# Patient Record
Sex: Female | Born: 1955 | Race: White | Hispanic: No | Marital: Married | Smoking: Never smoker
Health system: Southern US, Community
[De-identification: ages and names within clinical notes are randomized; demographics above are authoritative.]

---

## 2004-12-01 ENCOUNTER — Ambulatory Visit: Payer: Self-pay | Admitting: Unknown Physician Specialty

## 2005-01-28 ENCOUNTER — Ambulatory Visit: Payer: Self-pay | Admitting: General Surgery

## 2005-02-01 ENCOUNTER — Ambulatory Visit: Payer: Self-pay | Admitting: General Surgery

## 2005-08-09 ENCOUNTER — Ambulatory Visit: Payer: Self-pay | Admitting: Family Medicine

## 2005-10-07 ENCOUNTER — Ambulatory Visit: Payer: Self-pay | Admitting: Unknown Physician Specialty

## 2006-03-01 ENCOUNTER — Ambulatory Visit: Payer: Self-pay | Admitting: General Surgery

## 2006-04-21 ENCOUNTER — Ambulatory Visit: Payer: Self-pay | Admitting: Urology

## 2006-08-05 ENCOUNTER — Ambulatory Visit: Payer: Self-pay | Admitting: Urology

## 2006-08-08 ENCOUNTER — Ambulatory Visit: Payer: Self-pay | Admitting: Urology

## 2006-08-22 ENCOUNTER — Ambulatory Visit: Payer: Self-pay | Admitting: Urology

## 2006-09-01 ENCOUNTER — Ambulatory Visit: Payer: Self-pay | Admitting: Urology

## 2006-09-07 ENCOUNTER — Ambulatory Visit: Payer: Self-pay | Admitting: Urology

## 2006-09-08 ENCOUNTER — Ambulatory Visit: Payer: Self-pay | Admitting: Urology

## 2007-03-02 ENCOUNTER — Ambulatory Visit: Payer: Self-pay | Admitting: General Surgery

## 2007-03-27 ENCOUNTER — Ambulatory Visit: Payer: Self-pay | Admitting: General Surgery

## 2007-03-28 ENCOUNTER — Ambulatory Visit: Payer: Self-pay | Admitting: Internal Medicine

## 2007-04-14 ENCOUNTER — Ambulatory Visit: Payer: Self-pay | Admitting: General Surgery

## 2007-04-21 ENCOUNTER — Ambulatory Visit: Payer: Self-pay | Admitting: Internal Medicine

## 2007-04-25 ENCOUNTER — Other Ambulatory Visit: Payer: Self-pay

## 2007-04-28 ENCOUNTER — Ambulatory Visit: Payer: Self-pay | Admitting: Internal Medicine

## 2007-05-02 ENCOUNTER — Ambulatory Visit: Payer: Self-pay | Admitting: General Surgery

## 2007-05-05 ENCOUNTER — Ambulatory Visit: Payer: Self-pay | Admitting: Oncology

## 2007-05-29 ENCOUNTER — Ambulatory Visit: Payer: Self-pay | Admitting: Internal Medicine

## 2007-06-28 ENCOUNTER — Ambulatory Visit: Payer: Self-pay | Admitting: Internal Medicine

## 2007-07-19 ENCOUNTER — Other Ambulatory Visit: Payer: Self-pay

## 2007-07-29 ENCOUNTER — Ambulatory Visit: Payer: Self-pay | Admitting: Internal Medicine

## 2007-08-28 ENCOUNTER — Ambulatory Visit: Payer: Self-pay | Admitting: Internal Medicine

## 2007-09-28 ENCOUNTER — Ambulatory Visit: Payer: Self-pay | Admitting: Radiation Oncology

## 2007-09-28 ENCOUNTER — Ambulatory Visit: Payer: Self-pay | Admitting: Internal Medicine

## 2007-10-29 ENCOUNTER — Ambulatory Visit: Payer: Self-pay | Admitting: Internal Medicine

## 2007-11-26 ENCOUNTER — Ambulatory Visit: Payer: Self-pay | Admitting: Internal Medicine

## 2007-12-11 ENCOUNTER — Encounter: Payer: Self-pay | Admitting: General Surgery

## 2007-12-27 ENCOUNTER — Ambulatory Visit: Payer: Self-pay | Admitting: Internal Medicine

## 2007-12-27 ENCOUNTER — Encounter: Payer: Self-pay | Admitting: General Surgery

## 2008-01-26 ENCOUNTER — Ambulatory Visit: Payer: Self-pay | Admitting: Oncology

## 2008-01-30 IMAGING — CT CT STONE STUDY
1 of 2 series · 15 of 32 positions shown, 19 images · non-contrast
Comparison: none

REASON FOR EXAM: Urethral calculus
COMMENTS:

[Series 2: stone · axial · 0.69mm/px · z∈[-986,-605]mm · 15 of 144 slices shown, 19 images]
[im 11/144  soft-tissue]
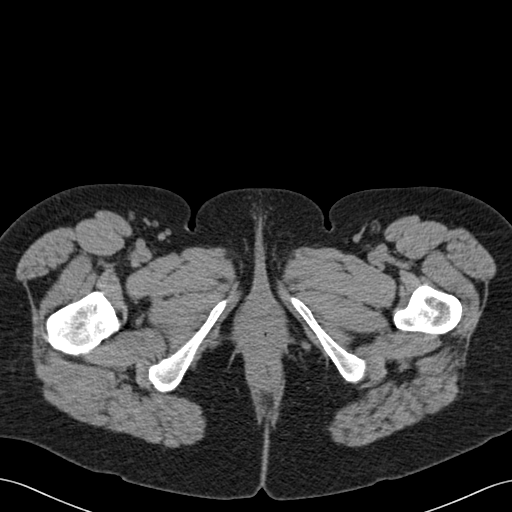
[im 11/144  bone]
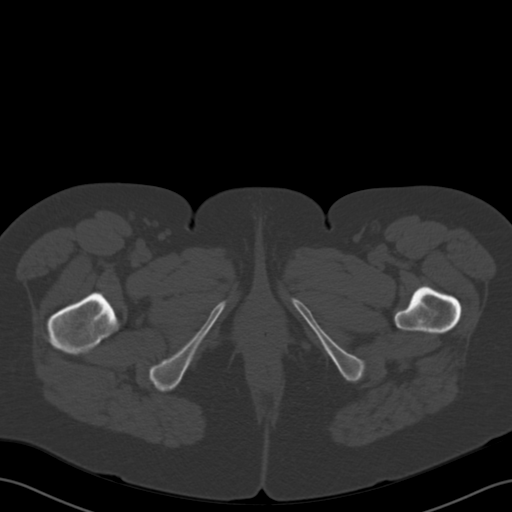
[im 21/144  soft-tissue]
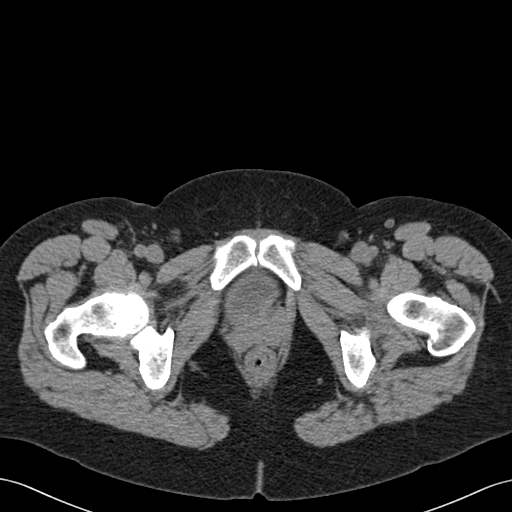
[im 31/144  soft-tissue]
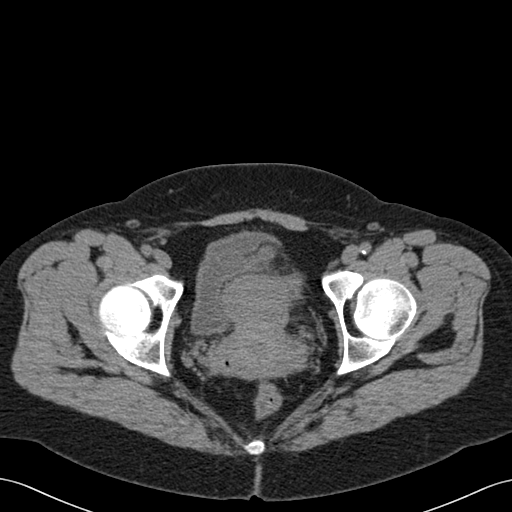
[im 41/144  soft-tissue]
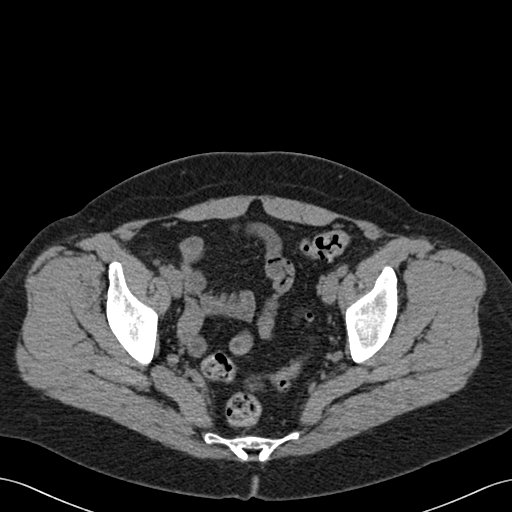
[im 52/144  soft-tissue]
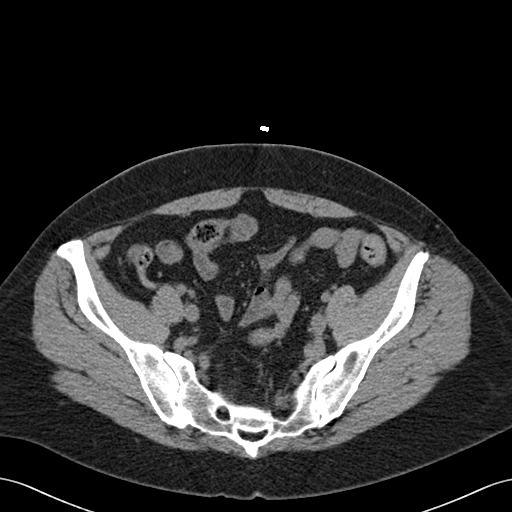
[im 62/144  soft-tissue]
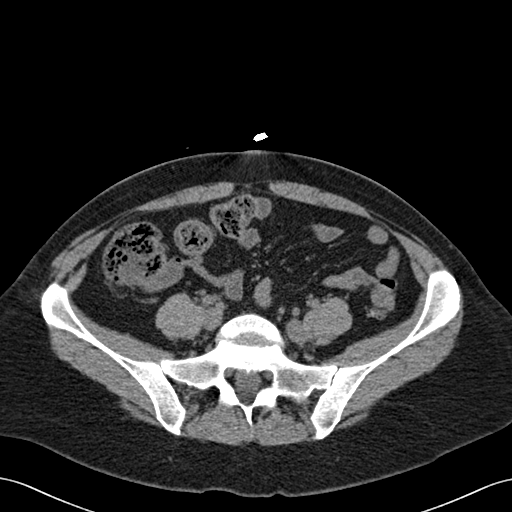
[im 72/144  soft-tissue]
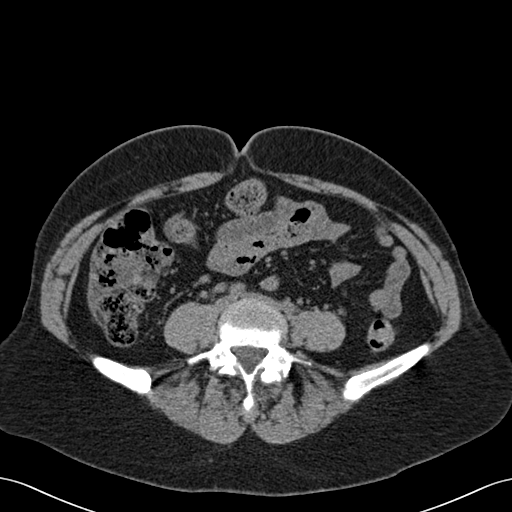
[im 82/144  soft-tissue]
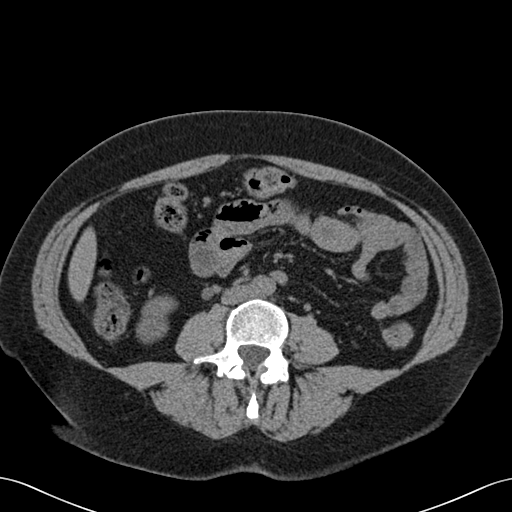
[im 92/144  soft-tissue]
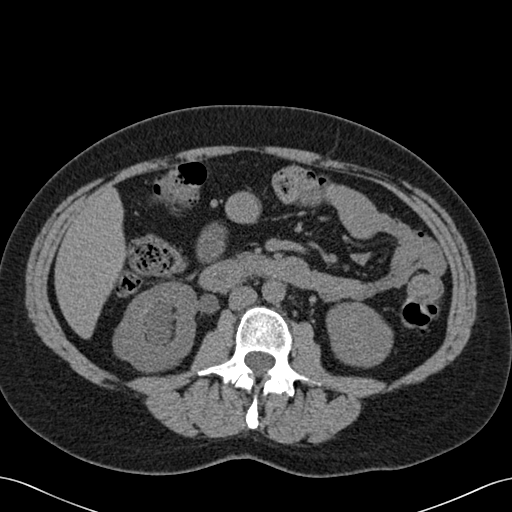
[im 92/144  bone]
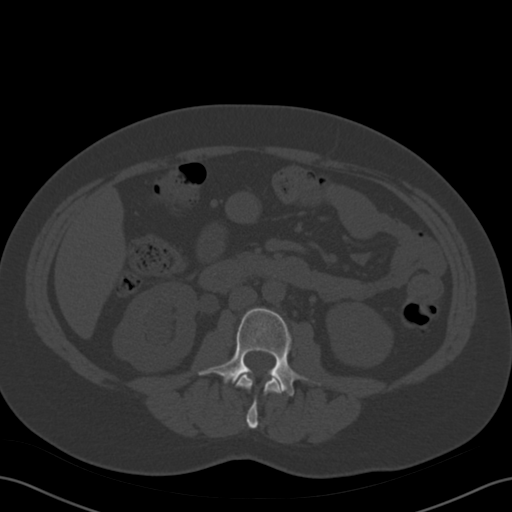
[im 103/144  soft-tissue]
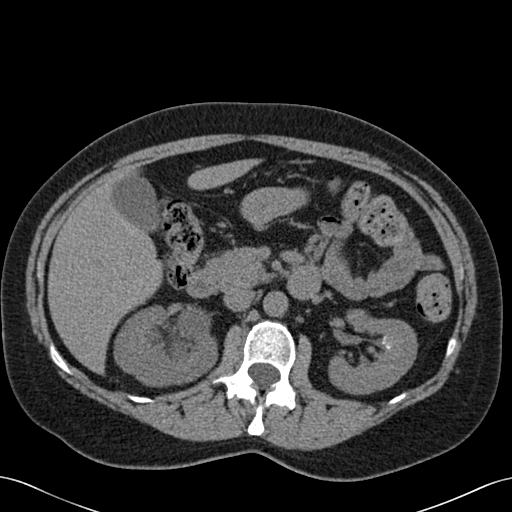
[im 113/144  soft-tissue]
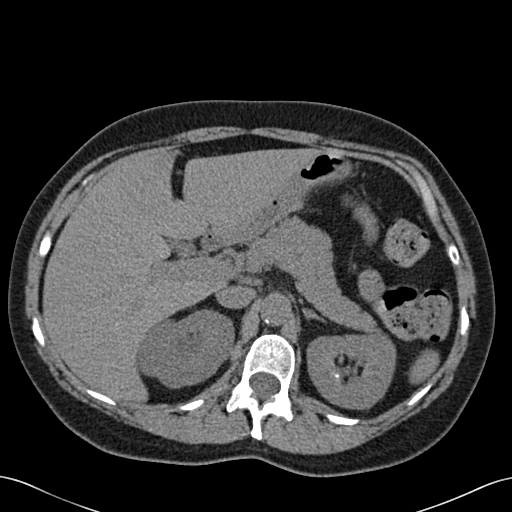
[im 123/144  soft-tissue]
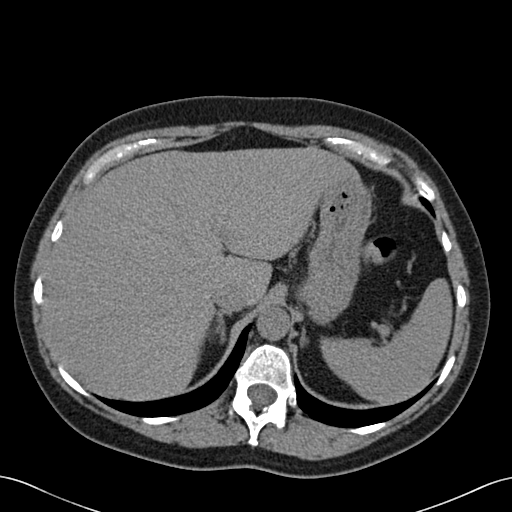
[im 123/144  lung]
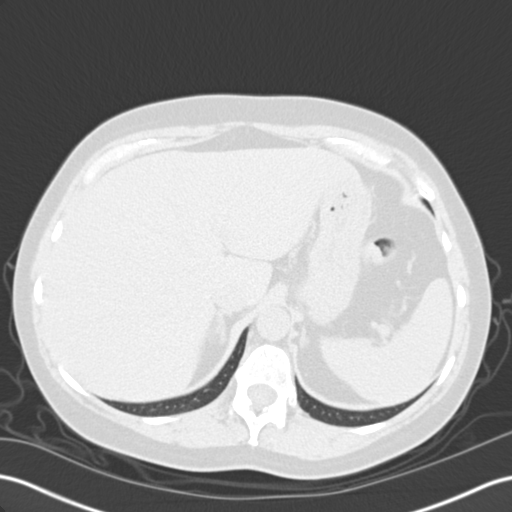
[im 128/144  lung]
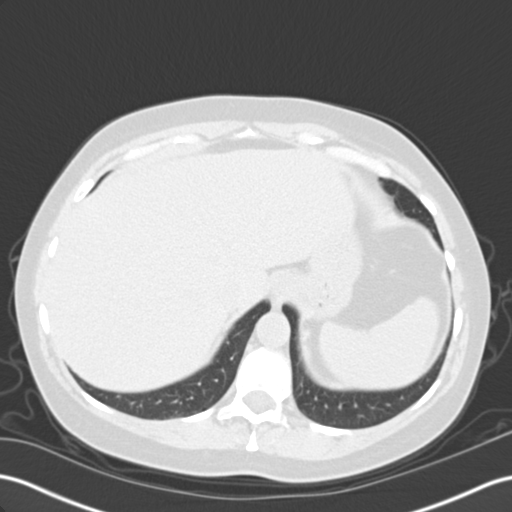
[im 133/144  soft-tissue]
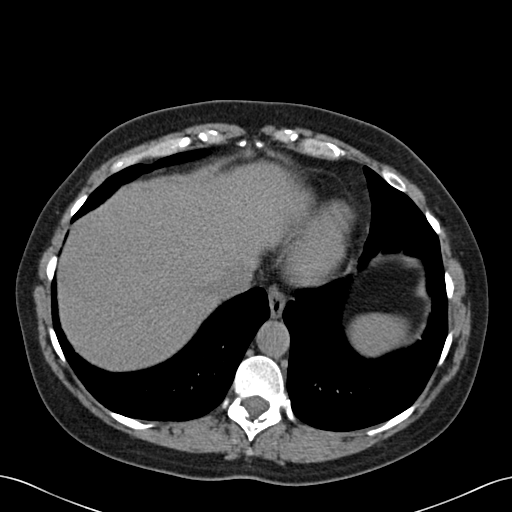
[im 133/144  lung]
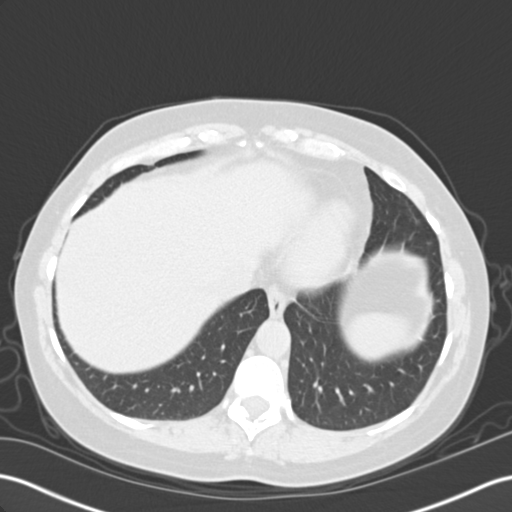
[im 138/144  lung]
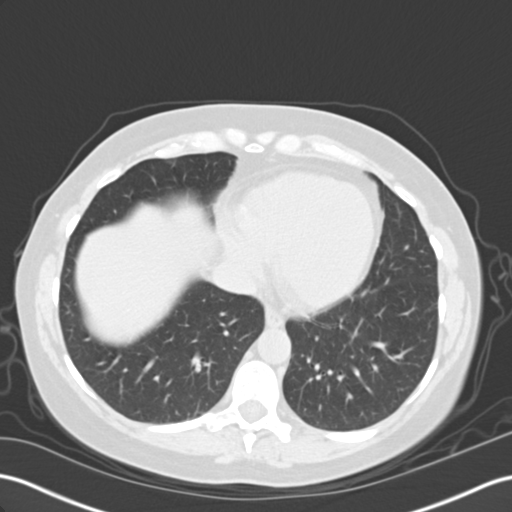

[15 of 32 positions shown; findings below may reference images not displayed]

PROCEDURE:     CT  - CT ABDOMEN /PELVIS WO (STONE)  - September 07, 2006  [DATE]

RESULT:       Helical noncontrast 3 mm sections were obtained from the lung
bases through the pubic symphysis.

Evaluation of the lung bases demonstrates no gross abnormalities.

Evaluation of the RIGHT kidney demonstrates moderate to mildly severe
hydronephrosis involving the kidney as well as hydroureter.  A calculus is
demonstrated within the mid to distal RIGHT ureter near the L4 level.  This
is at the level of the pelvic inlet.  When compared to previous studies,
this ureteral calculus is unchanged.  There does not appear to be associated
significant periureteral inflammatory change.  Evaluation of the LEFT kidney
once again demonstrates multiple nonobstructing medullary calculi within the
midpole region of the kidney primarily posteriorly.  These findings appear
unchanged.  Noncontrast evaluation of the liver, spleen, adrenals, pancreas
is unremarkable.  There does not appear to be evidence of abdominal free
fluid, drainable loculated fluid collections, adenopathy or masses.  There
is no evidence of abdominal aortic aneurysm identified.  Evaluation of the
pelvis demonstrates no evidence of free fluid, drainable loculated fluid
collection, masses or adenopathy.
IMPRESSION: 1.     Stable known RIGHT ureteral calculus as described above with
associated moderate to severe obstructive uropathy.
2.     Stable nonobstructing calculi within the LEFT kidney.

## 2008-02-01 ENCOUNTER — Ambulatory Visit: Payer: Self-pay | Admitting: Oncology

## 2008-02-26 ENCOUNTER — Ambulatory Visit: Payer: Self-pay | Admitting: Oncology

## 2008-03-04 ENCOUNTER — Ambulatory Visit: Payer: Self-pay | Admitting: General Surgery

## 2008-03-27 ENCOUNTER — Ambulatory Visit: Payer: Self-pay | Admitting: Oncology

## 2008-04-27 ENCOUNTER — Ambulatory Visit: Payer: Self-pay | Admitting: Oncology

## 2008-05-28 ENCOUNTER — Ambulatory Visit: Payer: Self-pay | Admitting: Oncology

## 2008-06-27 ENCOUNTER — Ambulatory Visit: Payer: Self-pay | Admitting: Oncology

## 2008-07-28 ENCOUNTER — Ambulatory Visit: Payer: Self-pay | Admitting: Oncology

## 2008-08-23 ENCOUNTER — Ambulatory Visit: Payer: Self-pay | Admitting: Oncology

## 2008-08-27 ENCOUNTER — Ambulatory Visit: Payer: Self-pay | Admitting: Oncology

## 2008-08-30 ENCOUNTER — Ambulatory Visit: Payer: Self-pay | Admitting: Urology

## 2008-09-23 IMAGING — CR DG CHEST 1V PORT
1 series · 1 of 1 positions shown · non-contrast
Comparison: none

REASON FOR EXAM: port a cath placement. PLEASE COMPLETE ERECT!
COMMENTS:

[view not recorded]
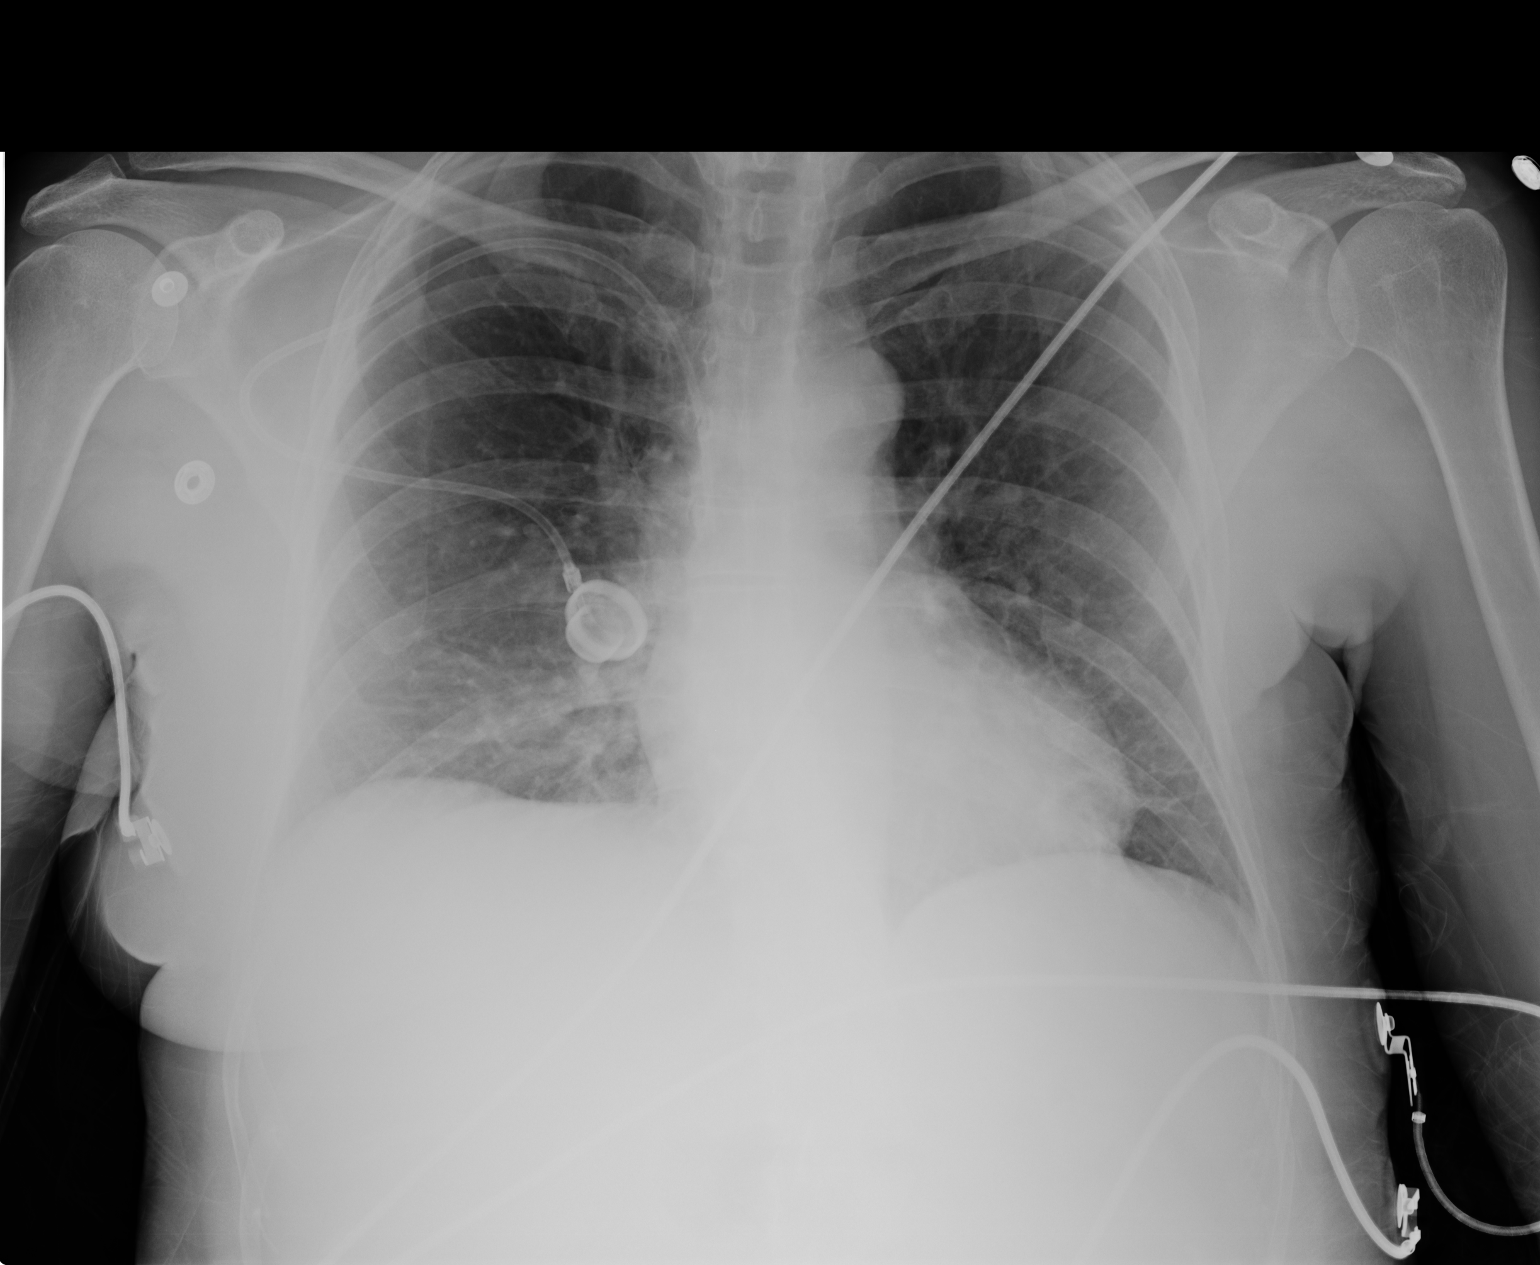

[1 of 1 positions shown; findings below may reference images not displayed]

PROCEDURE:     DXR - DXR PORTABLE CHEST SINGLE VIEW  - May 02, 2007  [DATE]

RESULT:     AP view of the chest shows a Port-A-Cath to be present. The tip
is projected near the junction of the superior vena cava and RIGHT atrium.
No pneumothorax is seen. No pleural effusion is observed. The lung fields
are clear except for slight discoid atelectasis at the LEFT base.
IMPRESSION: 1. The tip of the Port-A-Cath is projected near the junction of the superior
vena cava and RIGHT atrium.
2. No pneumothorax is seen.

## 2008-09-27 ENCOUNTER — Ambulatory Visit: Payer: Self-pay | Admitting: Oncology

## 2008-10-28 ENCOUNTER — Ambulatory Visit: Payer: Self-pay | Admitting: Oncology

## 2008-11-25 ENCOUNTER — Ambulatory Visit: Payer: Self-pay | Admitting: Oncology

## 2008-12-06 ENCOUNTER — Ambulatory Visit: Payer: Self-pay | Admitting: Radiation Oncology

## 2008-12-26 ENCOUNTER — Ambulatory Visit: Payer: Self-pay | Admitting: Radiation Oncology

## 2008-12-26 ENCOUNTER — Ambulatory Visit: Payer: Self-pay | Admitting: Oncology

## 2009-02-04 ENCOUNTER — Ambulatory Visit: Payer: Self-pay | Admitting: Unknown Physician Specialty

## 2009-02-11 ENCOUNTER — Ambulatory Visit: Payer: Self-pay | Admitting: Unknown Physician Specialty

## 2009-03-06 ENCOUNTER — Ambulatory Visit: Payer: Self-pay | Admitting: General Surgery

## 2009-03-27 ENCOUNTER — Ambulatory Visit: Payer: Self-pay | Admitting: Oncology

## 2009-04-09 ENCOUNTER — Ambulatory Visit: Payer: Self-pay | Admitting: Oncology

## 2009-04-27 ENCOUNTER — Ambulatory Visit: Payer: Self-pay | Admitting: Oncology

## 2009-05-02 ENCOUNTER — Ambulatory Visit: Payer: Self-pay | Admitting: General Surgery

## 2009-07-18 ENCOUNTER — Ambulatory Visit: Payer: Self-pay | Admitting: Oncology

## 2009-07-28 ENCOUNTER — Ambulatory Visit: Payer: Self-pay | Admitting: Oncology

## 2009-08-27 ENCOUNTER — Ambulatory Visit: Payer: Self-pay | Admitting: Oncology

## 2009-11-25 ENCOUNTER — Ambulatory Visit: Payer: Self-pay | Admitting: Oncology

## 2009-12-18 ENCOUNTER — Ambulatory Visit: Payer: Self-pay | Admitting: Oncology

## 2009-12-26 ENCOUNTER — Ambulatory Visit: Payer: Self-pay | Admitting: Oncology

## 2010-01-25 ENCOUNTER — Ambulatory Visit: Payer: Self-pay | Admitting: Oncology

## 2010-01-30 ENCOUNTER — Ambulatory Visit: Payer: Self-pay | Admitting: Oncology

## 2010-02-25 ENCOUNTER — Ambulatory Visit: Payer: Self-pay | Admitting: Oncology

## 2010-03-09 ENCOUNTER — Ambulatory Visit: Payer: Self-pay | Admitting: General Surgery

## 2010-03-27 ENCOUNTER — Ambulatory Visit: Payer: Self-pay | Admitting: Oncology

## 2010-04-03 ENCOUNTER — Inpatient Hospital Stay: Payer: Self-pay | Admitting: Internal Medicine

## 2010-04-27 ENCOUNTER — Ambulatory Visit: Payer: Self-pay | Admitting: Oncology

## 2010-05-23 LAB — CANCER ANTIGEN 27.29: CA 27.29: 16 U/mL (ref 0.0–38.6)

## 2010-05-28 ENCOUNTER — Ambulatory Visit: Payer: Self-pay | Admitting: Oncology

## 2010-11-16 ENCOUNTER — Ambulatory Visit: Payer: Self-pay | Admitting: Oncology

## 2010-11-17 LAB — CANCER ANTIGEN 27.29: CA 27.29: 12.1 U/mL (ref 0.0–38.6)

## 2010-11-26 ENCOUNTER — Ambulatory Visit: Payer: Self-pay | Admitting: Oncology

## 2010-12-15 ENCOUNTER — Ambulatory Visit: Payer: Self-pay | Admitting: Internal Medicine

## 2010-12-27 ENCOUNTER — Ambulatory Visit: Payer: Self-pay | Admitting: Oncology

## 2011-03-18 ENCOUNTER — Ambulatory Visit: Payer: Self-pay | Admitting: General Surgery

## 2011-04-05 ENCOUNTER — Ambulatory Visit: Payer: Self-pay | Admitting: Urology

## 2011-05-20 ENCOUNTER — Ambulatory Visit: Payer: Self-pay | Admitting: Oncology

## 2011-05-21 LAB — CANCER ANTIGEN 27.29: CA 27.29: 16.7 U/mL (ref 0.0–38.6)

## 2011-05-29 ENCOUNTER — Ambulatory Visit: Payer: Self-pay | Admitting: Oncology

## 2011-06-24 ENCOUNTER — Ambulatory Visit: Payer: Self-pay | Admitting: Urology

## 2011-06-28 ENCOUNTER — Ambulatory Visit: Payer: Self-pay | Admitting: Urology

## 2011-06-28 ENCOUNTER — Ambulatory Visit: Payer: Self-pay | Admitting: Oncology

## 2011-07-01 ENCOUNTER — Ambulatory Visit: Payer: Self-pay | Admitting: Urology

## 2011-07-15 ENCOUNTER — Ambulatory Visit: Payer: Self-pay | Admitting: Urology

## 2011-07-27 ENCOUNTER — Ambulatory Visit: Payer: Self-pay | Admitting: Urology

## 2011-10-29 ENCOUNTER — Ambulatory Visit: Payer: Self-pay | Admitting: Oncology

## 2011-10-29 LAB — CBC CANCER CENTER
Basophil %: 0.5 %
Eosinophil #: 0.1 x10 3/mm (ref 0.0–0.7)
Eosinophil %: 0.9 %
HGB: 13.6 g/dL (ref 12.0–16.0)
MCH: 32 pg (ref 26.0–34.0)
MCV: 92 fL (ref 80–100)
Monocyte %: 6.3 %
Neutrophil %: 67.6 %
Platelet: 312 x10 3/mm (ref 150–440)
RBC: 4.26 10*6/uL (ref 3.80–5.20)
WBC: 8 x10 3/mm (ref 3.6–11.0)

## 2011-10-29 LAB — COMPREHENSIVE METABOLIC PANEL
Albumin: 3.7 g/dL (ref 3.4–5.0)
Alkaline Phosphatase: 119 U/L (ref 50–136)
BUN: 15 mg/dL (ref 7–18)
Bilirubin,Total: 0.2 mg/dL (ref 0.2–1.0)
Co2: 29 mmol/L (ref 21–32)
Creatinine: 0.87 mg/dL (ref 0.60–1.30)
EGFR (African American): >60
EGFR (Non-African Amer.): >60
Glucose: 87 mg/dL (ref 65–99)
Osmolality: 281 (ref 275–301)
SGOT(AST): 18 U/L (ref 15–37)
SGPT (ALT): 34 U/L
Sodium: 141 mmol/L (ref 136–145)
Total Protein: 7.4 g/dL (ref 6.4–8.2)

## 2011-10-30 LAB — CANCER ANTIGEN 27.29: CA 27.29: 15.4 U/mL (ref 0.0–38.6)

## 2011-11-26 ENCOUNTER — Ambulatory Visit: Payer: Self-pay | Admitting: Oncology

## 2012-04-21 ENCOUNTER — Ambulatory Visit: Payer: Self-pay | Admitting: Oncology

## 2012-04-21 LAB — COMPREHENSIVE METABOLIC PANEL
Albumin: 3.6 g/dL (ref 3.4–5.0)
Alkaline Phosphatase: 116 U/L (ref 50–136)
Anion Gap: 7 (ref 7–16)
Calcium, Total: 9.8 mg/dL (ref 8.5–10.1)
Chloride: 103 mmol/L (ref 98–107)
Co2: 29 mmol/L (ref 21–32)
EGFR (Non-African Amer.): >60
Glucose: 95 mg/dL (ref 65–99)
Osmolality: 279 (ref 275–301)
Potassium: 3.9 mmol/L (ref 3.5–5.1)
SGOT(AST): 27 U/L (ref 15–37)
Sodium: 139 mmol/L (ref 136–145)

## 2012-04-21 LAB — CBC CANCER CENTER
Basophil #: 0 x10 3/mm (ref 0.0–0.1)
Eosinophil #: 0.1 x10 3/mm (ref 0.0–0.7)
HCT: 40.4 % (ref 35.0–47.0)
Lymphocyte #: 1.6 x10 3/mm (ref 1.0–3.6)
Lymphocyte %: 29.5 %
MCHC: 34.3 g/dL (ref 32.0–36.0)
MCV: 92 fL (ref 80–100)
Monocyte #: 0.4 x10 3/mm (ref 0.2–0.9)
Monocyte %: 6.5 %
Neutrophil #: 3.5 x10 3/mm (ref 1.4–6.5)
Platelet: 292 x10 3/mm (ref 150–440)
RBC: 4.38 10*6/uL (ref 3.80–5.20)
RDW: 13 % (ref 11.5–14.5)

## 2012-04-27 ENCOUNTER — Ambulatory Visit: Payer: Self-pay | Admitting: Oncology

## 2012-08-27 IMAGING — CR DG ABDOMEN 1V
1 series · 1 of 1 positions shown · non-contrast
Comparison: none

REASON FOR EXAM: nephrolithiasis
COMMENTS:

[view not recorded]
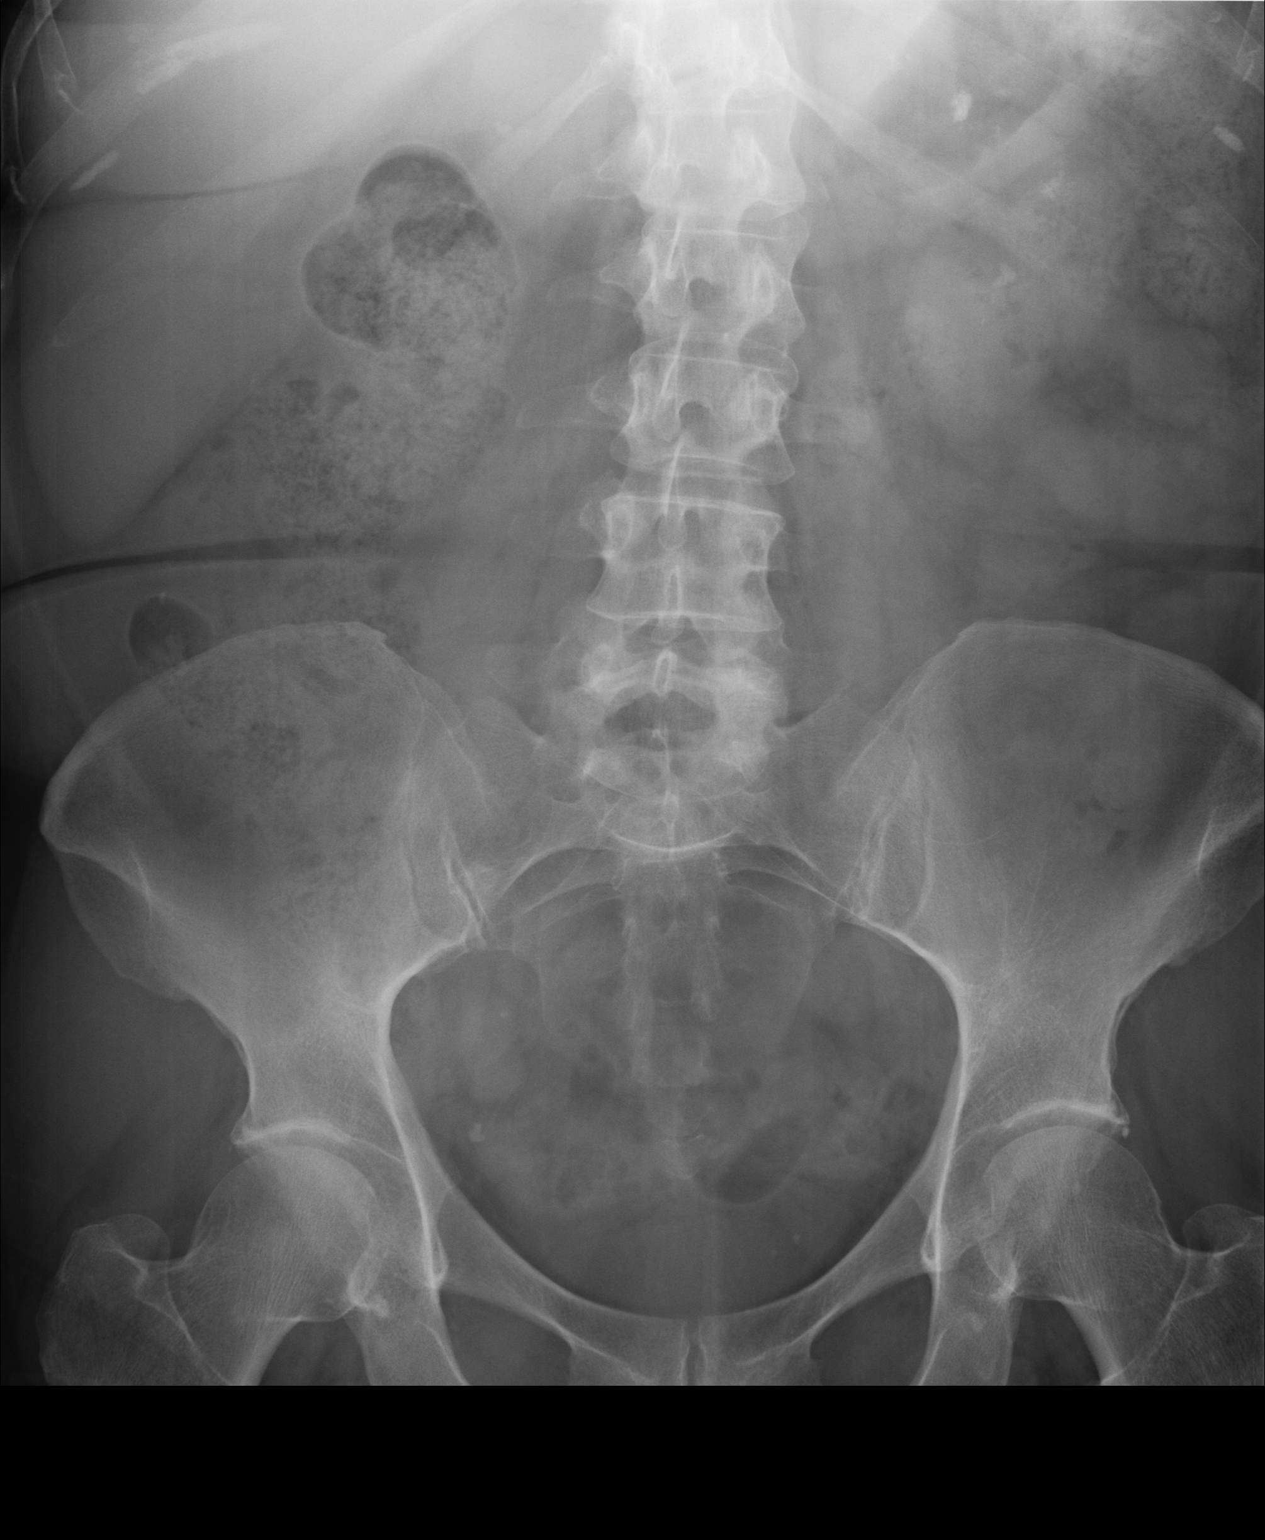

[1 of 1 positions shown; findings below may reference images not displayed]

PROCEDURE:     DXR - DXR KIDNEY URETER BLADDER  - April 05, 2011 [DATE]

RESULT:     There are observed multiple calcifications involving the left
kidney with involvement of the upper mid and lower pole regions. The largest
stone is at the upper pole and measures approximately 9 mm at maximum
diameter. The right kidney is partially obscured by overlying bowel and
bowel content. There is a 5 mm calcification at the upper pole of the right
kidney compatible with a right renal stone. Additional right renal
calcifications may be present and obscured by overlying bowel. No definite
ureteral calcifications are seen. Phleboliths are noted in the pelvis
bilaterally.
IMPRESSION: Bilateral nephrolithiasis.

## 2012-10-20 ENCOUNTER — Ambulatory Visit: Payer: Self-pay | Admitting: Oncology

## 2012-10-20 LAB — COMPREHENSIVE METABOLIC PANEL
Albumin: 3.8 g/dL (ref 3.4–5.0)
Anion Gap: 8 (ref 7–16)
Calcium, Total: 9.5 mg/dL (ref 8.5–10.1)
Co2: 31 mmol/L (ref 21–32)
Creatinine: 0.74 mg/dL (ref 0.60–1.30)
Osmolality: 285 (ref 275–301)
SGPT (ALT): 49 U/L (ref 12–78)
Sodium: 143 mmol/L (ref 136–145)
Total Protein: 7.5 g/dL (ref 6.4–8.2)

## 2012-10-20 LAB — CBC CANCER CENTER
Basophil #: 0 x10 3/mm (ref 0.0–0.1)
Basophil %: 0.7 %
Eosinophil #: 0.1 x10 3/mm (ref 0.0–0.7)
Eosinophil %: 1.8 %
HCT: 41.9 % (ref 35.0–47.0)
Lymphocyte #: 2 x10 3/mm (ref 1.0–3.6)
MCH: 30.6 pg (ref 26.0–34.0)
MCHC: 33.4 g/dL (ref 32.0–36.0)
Monocyte %: 7.9 %
Neutrophil #: 4.1 x10 3/mm (ref 1.4–6.5)
Neutrophil %: 60 %
Platelet: 259 x10 3/mm (ref 150–440)
RBC: 4.57 10*6/uL (ref 3.80–5.20)
WBC: 6.9 x10 3/mm (ref 3.6–11.0)

## 2012-10-23 LAB — CANCER ANTIGEN 27.29: CA 27.29: 15.9 U/mL (ref 0.0–38.6)

## 2012-10-28 ENCOUNTER — Ambulatory Visit: Payer: Self-pay | Admitting: Oncology

## 2012-11-19 IMAGING — CR DG ABDOMEN 1V
1 series · 1 of 1 positions shown · non-contrast
Comparison: none

REASON FOR EXAM: Nephrolithiasis
COMMENTS:

[view not recorded]
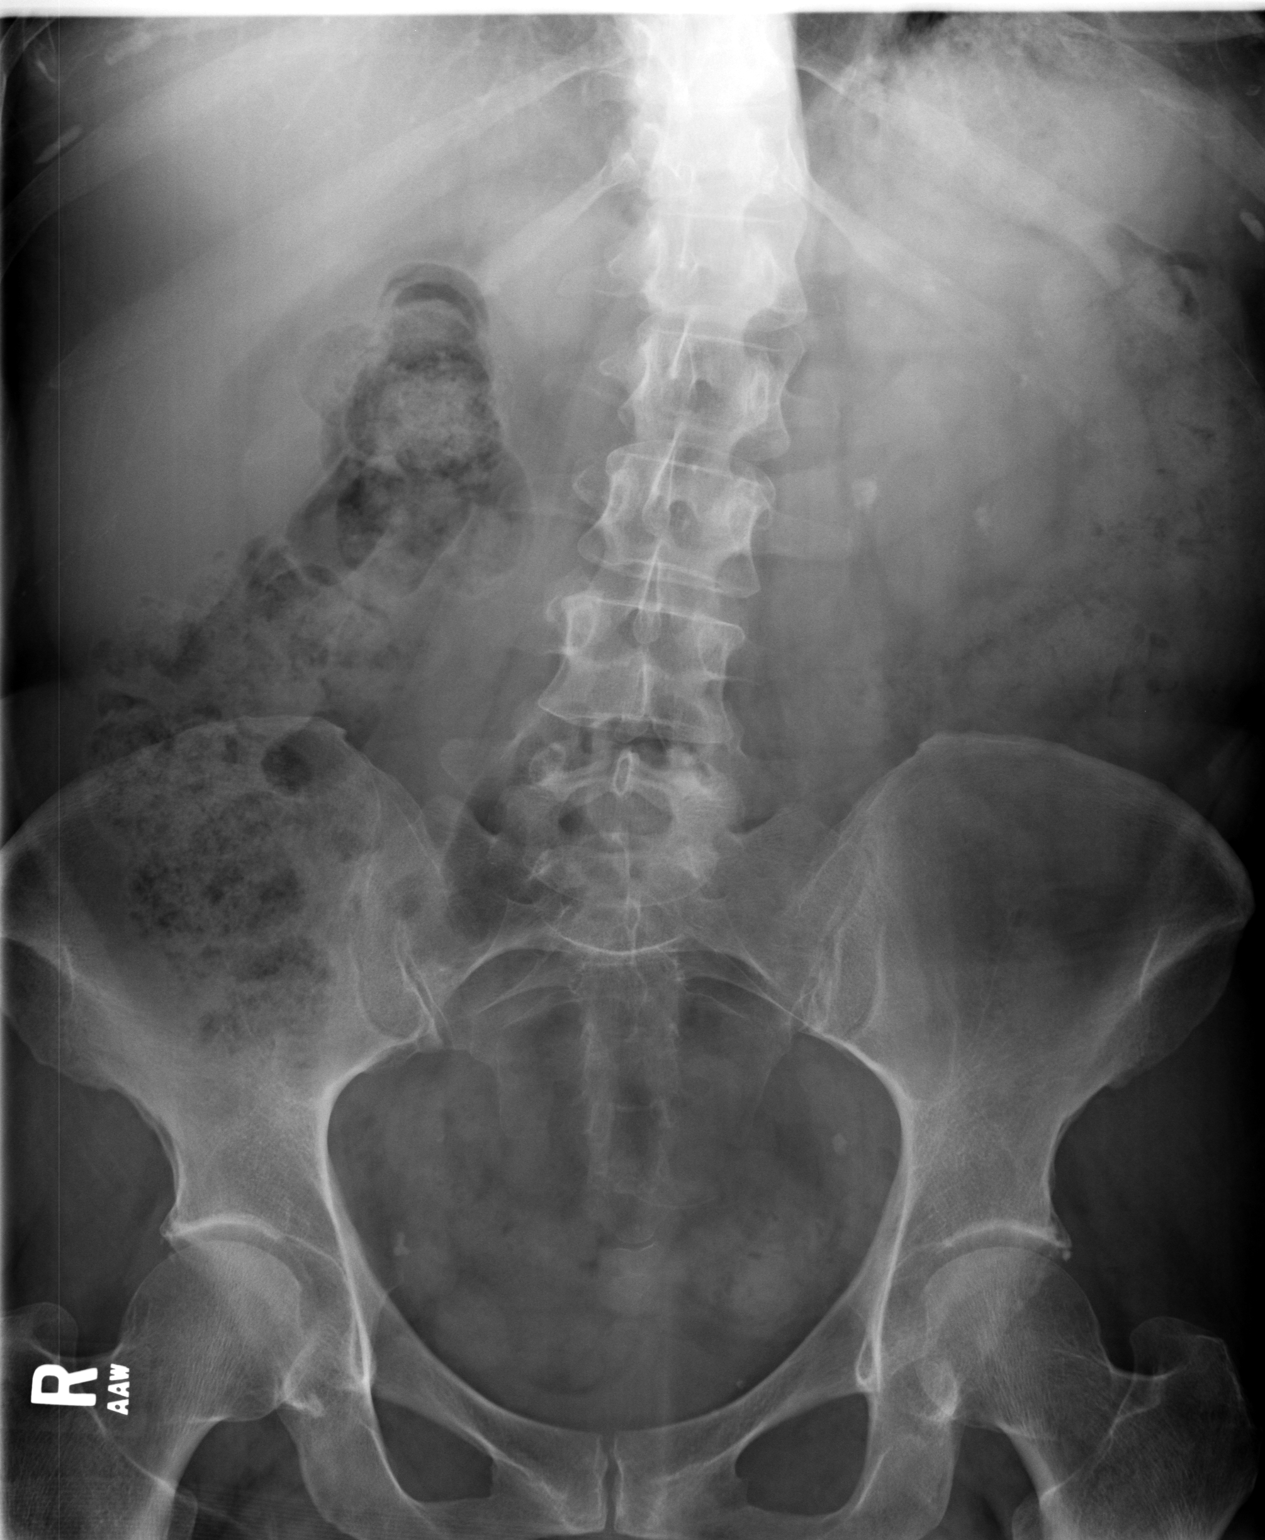

[1 of 1 positions shown; findings below may reference images not displayed]

PROCEDURE:     DXR - DXR KIDNEY URETER BLADDER  - June 28, 2011  [DATE]

RESULT:     An AP view of the abdomen is compared to the prior exam of
06/24/2011. There are multiple left renal calcifications consistent with
nephrolithiasis involving the upper, mid and lower pole. In addition there
is an 8 mm calcification projected in the region of the left renal pelvis.
There is a 6 mm calcification observed in the true pelvis approximately 6 or
7 cm superior to the left ureterovesical junction and consistent with a
distal left ureteral stone. A phlebolith is noted in the right pelvis. No
definite right renal stones are seen but the right kidney is in great part
obscured by overlying bowel and bowel content.
IMPRESSION: 1. Left nephrolithiasis.
2. Distal left ureterolithiasis.
3. No definite renal or ureteral stones are seen on the right.

## 2013-10-19 ENCOUNTER — Ambulatory Visit: Payer: Self-pay | Admitting: Oncology

## 2013-10-19 LAB — COMPREHENSIVE METABOLIC PANEL
ALBUMIN: 3.5 g/dL (ref 3.4–5.0)
ALK PHOS: 113 U/L
Anion Gap: 7 (ref 7–16)
BUN: 14 mg/dL (ref 7–18)
Bilirubin,Total: 0.2 mg/dL (ref 0.2–1.0)
CALCIUM: 9.4 mg/dL (ref 8.5–10.1)
CHLORIDE: 105 mmol/L (ref 98–107)
Co2: 31 mmol/L (ref 21–32)
Creatinine: 0.76 mg/dL (ref 0.60–1.30)
EGFR (African American): >60
EGFR (Non-African Amer.): >60
Glucose: 84 mg/dL (ref 65–99)
OSMOLALITY: 285 (ref 275–301)
POTASSIUM: 3.6 mmol/L (ref 3.5–5.1)
SGOT(AST): 38 U/L — ABNORMAL HIGH (ref 15–37)
SGPT (ALT): 71 U/L (ref 12–78)
Sodium: 143 mmol/L (ref 136–145)
TOTAL PROTEIN: 7.1 g/dL (ref 6.4–8.2)

## 2013-10-19 LAB — CBC CANCER CENTER
Basophil #: 0 x10 3/mm (ref 0.0–0.1)
Basophil %: 0.6 %
EOS PCT: 1.6 %
Eosinophil #: 0.1 x10 3/mm (ref 0.0–0.7)
HCT: 40.2 % (ref 35.0–47.0)
HGB: 13.5 g/dL (ref 12.0–16.0)
LYMPHS ABS: 1.9 x10 3/mm (ref 1.0–3.6)
Lymphocyte %: 30.1 %
MCH: 30.8 pg (ref 26.0–34.0)
MCHC: 33.6 g/dL (ref 32.0–36.0)
MCV: 92 fL (ref 80–100)
MONO ABS: 0.4 x10 3/mm (ref 0.2–0.9)
Monocyte %: 6.3 %
NEUTROS PCT: 61.4 %
Neutrophil #: 3.9 x10 3/mm (ref 1.4–6.5)
Platelet: 275 x10 3/mm (ref 150–440)
RBC: 4.38 10*6/uL (ref 3.80–5.20)
RDW: 13 % (ref 11.5–14.5)
WBC: 6.3 x10 3/mm (ref 3.6–11.0)

## 2013-10-20 LAB — CANCER ANTIGEN 27.29: CA 27.29: 18.4 U/mL (ref 0.0–38.6)

## 2013-10-28 ENCOUNTER — Ambulatory Visit: Payer: Self-pay | Admitting: Oncology

## 2014-01-09 DIAGNOSIS — N200 Calculus of kidney: Secondary | ICD-10-CM | POA: Insufficient documentation

## 2014-09-13 ENCOUNTER — Ambulatory Visit: Payer: Self-pay | Admitting: Oncology

## 2014-09-13 LAB — COMPREHENSIVE METABOLIC PANEL
Albumin: 3.3 g/dL — ABNORMAL LOW (ref 3.4–5.0)
Alkaline Phosphatase: 126 U/L — ABNORMAL HIGH
Anion Gap: 10 (ref 7–16)
BILIRUBIN TOTAL: 0.3 mg/dL (ref 0.2–1.0)
BUN: 11 mg/dL (ref 7–18)
CREATININE: 0.84 mg/dL (ref 0.60–1.30)
Calcium, Total: 9.4 mg/dL (ref 8.5–10.1)
Chloride: 104 mmol/L (ref 98–107)
Co2: 30 mmol/L (ref 21–32)
EGFR (African American): >60
Glucose: 109 mg/dL — ABNORMAL HIGH (ref 65–99)
Osmolality: 287 (ref 275–301)
Potassium: 3.4 mmol/L — ABNORMAL LOW (ref 3.5–5.1)
SGOT(AST): 33 U/L (ref 15–37)
SGPT (ALT): 48 U/L
SODIUM: 144 mmol/L (ref 136–145)
TOTAL PROTEIN: 7.3 g/dL (ref 6.4–8.2)

## 2014-09-13 LAB — CBC CANCER CENTER
Basophil #: 0 x10 3/mm (ref 0.0–0.1)
Basophil %: 0.5 %
Eosinophil #: 0.1 x10 3/mm (ref 0.0–0.7)
Eosinophil %: 2.3 %
HCT: 41.1 % (ref 35.0–47.0)
HGB: 13.6 g/dL (ref 12.0–16.0)
Lymphocyte #: 1.5 x10 3/mm (ref 1.0–3.6)
Lymphocyte %: 23.3 %
MCH: 31.1 pg (ref 26.0–34.0)
MCHC: 33.1 g/dL (ref 32.0–36.0)
MCV: 94 fL (ref 80–100)
MONOS PCT: 5.7 %
Monocyte #: 0.4 x10 3/mm (ref 0.2–0.9)
NEUTROS ABS: 4.4 x10 3/mm (ref 1.4–6.5)
Neutrophil %: 68.2 %
PLATELETS: 283 x10 3/mm (ref 150–440)
RBC: 4.37 10*6/uL (ref 3.80–5.20)
RDW: 13.4 % (ref 11.5–14.5)
WBC: 6.5 x10 3/mm (ref 3.6–11.0)

## 2014-09-16 LAB — CANCER ANTIGEN 27.29: CA 27.29: 20.2 U/mL (ref 0.0–38.6)

## 2014-09-27 ENCOUNTER — Ambulatory Visit: Payer: Self-pay | Admitting: Oncology

## 2015-02-16 ENCOUNTER — Other Ambulatory Visit: Payer: Self-pay | Admitting: Oncology

## 2015-02-22 ENCOUNTER — Other Ambulatory Visit: Payer: Self-pay | Admitting: Oncology

## 2015-08-07 ENCOUNTER — Other Ambulatory Visit: Payer: Self-pay | Admitting: *Deleted

## 2015-08-07 DIAGNOSIS — C50919 Malignant neoplasm of unspecified site of unspecified female breast: Secondary | ICD-10-CM

## 2015-08-11 ENCOUNTER — Encounter: Payer: Self-pay | Admitting: Oncology

## 2015-08-14 ENCOUNTER — Encounter: Payer: Self-pay | Admitting: Oncology

## 2015-08-14 ENCOUNTER — Inpatient Hospital Stay: Payer: BC Managed Care – PPO | Attending: Oncology

## 2015-08-14 ENCOUNTER — Inpatient Hospital Stay (HOSPITAL_BASED_OUTPATIENT_CLINIC_OR_DEPARTMENT_OTHER): Payer: BC Managed Care – PPO | Admitting: Oncology

## 2015-08-14 VITALS — BP 147/86 | HR 84 | Temp 97.0°F | Wt 158.1 lb

## 2015-08-14 DIAGNOSIS — Z87891 Personal history of nicotine dependence: Secondary | ICD-10-CM | POA: Insufficient documentation

## 2015-08-14 DIAGNOSIS — Z79811 Long term (current) use of aromatase inhibitors: Secondary | ICD-10-CM | POA: Diagnosis not present

## 2015-08-14 DIAGNOSIS — Z923 Personal history of irradiation: Secondary | ICD-10-CM | POA: Insufficient documentation

## 2015-08-14 DIAGNOSIS — Z170 Estrogen receptor positive status [ER+]: Secondary | ICD-10-CM

## 2015-08-14 DIAGNOSIS — Z87442 Personal history of urinary calculi: Secondary | ICD-10-CM

## 2015-08-14 DIAGNOSIS — C50912 Malignant neoplasm of unspecified site of left female breast: Secondary | ICD-10-CM

## 2015-08-14 DIAGNOSIS — Z9221 Personal history of antineoplastic chemotherapy: Secondary | ICD-10-CM

## 2015-08-14 DIAGNOSIS — C50919 Malignant neoplasm of unspecified site of unspecified female breast: Secondary | ICD-10-CM

## 2015-08-14 DIAGNOSIS — Z23 Encounter for immunization: Secondary | ICD-10-CM

## 2015-08-14 LAB — CBC WITH DIFFERENTIAL/PLATELET
BASOS ABS: 0 10*3/uL (ref 0–0.1)
BASOS PCT: 1 %
EOS ABS: 0.1 10*3/uL (ref 0–0.7)
EOS PCT: 2 %
HEMATOCRIT: 41.1 % (ref 35.0–47.0)
Hemoglobin: 13.9 g/dL (ref 12.0–16.0)
Lymphocytes Relative: 34 %
Lymphs Abs: 2.3 10*3/uL (ref 1.0–3.6)
MCH: 30.4 pg (ref 26.0–34.0)
MCHC: 33.8 g/dL (ref 32.0–36.0)
MCV: 90 fL (ref 80.0–100.0)
MONO ABS: 0.5 10*3/uL (ref 0.2–0.9)
MONOS PCT: 8 %
NEUTROS ABS: 3.8 10*3/uL (ref 1.4–6.5)
Neutrophils Relative %: 55 %
PLATELETS: 289 10*3/uL (ref 150–440)
RBC: 4.56 MIL/uL (ref 3.80–5.20)
RDW: 13.1 % (ref 11.5–14.5)
WBC: 6.7 10*3/uL (ref 3.6–11.0)

## 2015-08-14 LAB — COMPREHENSIVE METABOLIC PANEL
ALBUMIN: 3.9 g/dL (ref 3.5–5.0)
ALT: 29 U/L (ref 14–54)
ANION GAP: 8 (ref 5–15)
AST: 25 U/L (ref 15–41)
Alkaline Phosphatase: 97 U/L (ref 38–126)
BILIRUBIN TOTAL: 0.5 mg/dL (ref 0.3–1.2)
BUN: 11 mg/dL (ref 6–20)
CHLORIDE: 102 mmol/L (ref 101–111)
CO2: 28 mmol/L (ref 22–32)
Calcium: 9.5 mg/dL (ref 8.9–10.3)
Creatinine, Ser: 0.65 mg/dL (ref 0.44–1.00)
GFR calc Af Amer: >60 mL/min (ref >60)
GFR calc non Af Amer: >60 mL/min (ref >60)
GLUCOSE: 113 mg/dL — AB (ref 65–99)
POTASSIUM: 3.4 mmol/L — AB (ref 3.5–5.1)
Sodium: 138 mmol/L (ref 135–145)
TOTAL PROTEIN: 7.1 g/dL (ref 6.5–8.1)

## 2015-08-14 MED ORDER — INFLUENZA VAC SPLIT QUAD 0.5 ML IM SUSY
0.5000 mL | PREFILLED_SYRINGE | Freq: Once | INTRAMUSCULAR | Status: AC
  Administered 2015-08-14: 0.5 mL via INTRAMUSCULAR

## 2015-08-14 NOTE — Progress Notes (Signed)
[place] @ [facility] [phone]  Fax:([phone] [place] OB: 06-Jun-1956  MR#: [id number]  UVO#:[id number]  Patient Care Team: [place] Pink, MD as PCP - General (Family Medicine)  CHIEF COMPLAINT:  Chief Complaint  Patient presents with  . OTHER   oncology history ;   Chief Complaint/Diagnosis carcinoma of breast, status postmastectomy (left side). Status postchemotherapy, under protocol with ECoG [id number]. Radiation therapy on Femara since March of  2009      No history exists.    No flowsheet data found.  INTERVAL HISTORY: 59 year old lady came today further follow-up regarding carcinoma breast.  Since last evaluation patient has lost 20 pounds overweight but cc has been on strict diet.  No bony pain.  Had a mammogram done as outside institution and has been reported to be   BIRAD-2. .did not have a bone density study last year and did not show any evidence of osteoporosis Patient is here for ongoing evaluation and treatment consideration Patient had a previous problem with kidney stones  REVIEW OF SYSTEMS:   GENERAL:  Feels good.  Active.  No fevers, sweats or weight loss. PERFORMANCE STATUS (ECOG):  0 HEENT:  No visual changes, runny nose, sore throat, mouth sores or tenderness. Lungs: No shortness of breath or cough.  No hemoptysis. Cardiac:  No chest pain, palpitations, orthopnea, or PND. GI:  No nausea, vomiting, diarrhea, constipation, melena or hematochezia. GU:  No urgency, frequency, dysuria, or hematuria. Musculoskeletal:  No back pain.  No joint pain.  No muscle tenderness. Extremities:  No pain or swelling. Skin:  No rashes or skin changes. Neuro:  No headache, numbness or weakness, balance or coordination issues. Endocrine:  No diabetes, thyroid issues, hot flashes or night sweats. Psych:  No mood changes, depression or anxiety. Pain:  No focal pain. Review of systems:  All other systems reviewed and found to be negative. As per  HPI. Otherwise, a complete review of systems is negatve.  Significant History/PMH:   Cancer, Breast:    uterine ablation:    tubal ligation:    reconstruction  gyn: at age 12  Preventive Screening:  Has patient had any of the following test? Colonscopy  Mammography  mammogram will be scheduled in June by Dr. Bary Castilla (1)   Last Colonoscopy: several years ago by Dr. Rito Ehrlich again in 8-9 yrs.(1)      Smoking History: Smoking History quit 11 years ago(1)  PFSH: Additional Past Medical and Surgical History: does not smoke. Does not drinkpast history, social, and family history is noncontributory  No family history of colorectal cancer, breast cancer, or ovarian cancer.   ADVANCED DIRECTIVES:  No flowsheet data found.  HEALTH MAINTENANCE: Social History  Substance Use Topics  . Smoking status: Never Smoker   . Smokeless tobacco: Not on file  . Alcohol Use: Not on file      Allergies  Allergen Reactions  . Amoxicillin Hives and Itching  . Codeine Nausea And Vomiting  . Morphine And Related Nausea And Vomiting  . Sulfa Antibiotics Rash    Current Outpatient Prescriptions  Medication Sig Dispense Refill  . ALPRAZolam (XANAX) 0.25 MG tablet     . atorvastatin (LIPITOR) 20 MG tablet     . Biotin 5000 MCG CAPS Take by mouth.    . eletriptan (RELPAX) 40 MG tablet     . letrozole (FEMARA) 2.5 MG tablet TAKE 1 TABLET BY MOUTH EVERY DAY 30 tablet 6  . Multiple Minerals-Vitamins (CALCIUM &  VIT D3 BONE HEALTH PO) Take by mouth.    . Multiple Vitamin (THERA) TABS Take 1 tablet by mouth.    . potassium citrate (UROCIT-K) 10 MEQ (1080 MG) SR tablet TAKE 1 TABLET (10 MEQ TOTAL) BY MOUTH 3 (THREE) TIMES DAILY WITH MEALS.    Marland Kitchen venlafaxine XR (EFFEXOR-XR) 75 MG 24 hr capsule TAKE ONE CAPSULE BY MOUTH EVERY DAY 30 capsule 6   No current facility-administered medications for this visit.    OBJECTIVE:  Filed Vitals:   08/14/15 1412  BP: 147/86  Pulse: 84  Temp: 97 F (36.1  C)     There is no height on file to calculate BMI.    ECOG FS:0 - Asymptomatic  PHYSICAL Exam GENERAL:  Well developed, well nourished, sitting comfortably in the exam room in no acute distress. MENTAL STATUS:  Alert and oriented to person, place and time.  ENT:  Oropharynx clear without lesion.  Tongue normal. Mucous membranes moist.  RESPIRATORY:  Clear to auscultation without rales, wheezes or rhonchi. CARDIOVASCULAR:  Regular rate and rhythm without murmur, rub or gallop. BREAST:  Right breast without masses, skin changes or nipple discharge.  Marland Kitchen Axillary nodes are not palpable Left chest wall area no evidence of recurrent disease. ABDOMEN:  Soft, non-tender, with active bowel sounds, and no hepatosplenomegaly.  No masses. BACK:  No CVA tenderness.  No tenderness on percussion of the back or rib cage. SKIN:  No rashes, ulcers or lesions. EXTREMITIES: No edema, no skin discoloration or tenderness.  No palpable cords. LYMPH NODES: No palpable cervical, supraclavicular, axillary or inguinal adenopathy  NEUROLOGICAL: Unremarkable. PSYCH:  Appropriate.  LAB RESULTS:  CBC Latest Ref Rng 08/14/2015 09/13/2014  WBC 3.6 - 11.0 K/uL 6.7 6.5  Hemoglobin 12.0 - 16.0 g/dL 13.9 13.6  Hematocrit 35.0 - 47.0 % 41.1 41.1  Platelets 150 - 440 K/uL 289 283    Appointment on 08/14/2015  Component Date Value Ref Range Status  . WBC 08/14/2015 6.7  3.6 - 11.0 K/uL Final  . RBC 08/14/2015 4.56  3.80 - 5.20 MIL/uL Final  . Hemoglobin 08/14/2015 13.9  12.0 - 16.0 g/dL Final  . HCT 08/14/2015 41.1  35.0 - 47.0 % Final  . MCV 08/14/2015 90.0  80.0 - 100.0 fL Final  . MCH 08/14/2015 30.4  26.0 - 34.0 pg Final  . MCHC 08/14/2015 33.8  32.0 - 36.0 g/dL Final  . RDW 08/14/2015 13.1  11.5 - 14.5 % Final  . Platelets 08/14/2015 289  150 - 440 K/uL Final  . Neutrophils Relative % 08/14/2015 55   Final  . Neutro Abs 08/14/2015 3.8  1.4 - 6.5 K/uL Final  . Lymphocytes Relative 08/14/2015 34   Final  .  Lymphs Abs 08/14/2015 2.3  1.0 - 3.6 K/uL Final  . Monocytes Relative 08/14/2015 8   Final  . Monocytes Absolute 08/14/2015 0.5  0.2 - 0.9 K/uL Final  . Eosinophils Relative 08/14/2015 2   Final  . Eosinophils Absolute 08/14/2015 0.1  0 - 0.7 K/uL Final  . Basophils Relative 08/14/2015 1   Final  . Basophils Absolute 08/14/2015 0.0  0 - 0.1 K/uL Final  . Sodium 08/14/2015 138  135 - 145 mmol/L Final  . Potassium 08/14/2015 3.4* 3.5 - 5.1 mmol/L Final  . Chloride 08/14/2015 102  101 - 111 mmol/L Final  . CO2 08/14/2015 28  22 - 32 mmol/L Final  . Glucose, Bld 08/14/2015 113* 65 - 99 mg/dL Final  . BUN 08/14/2015 11  6 - 20 mg/dL Final  . Creatinine, Ser 08/14/2015 0.65  0.44 - 1.00 mg/dL Final  . Calcium 08/14/2015 9.5  8.9 - 10.3 mg/dL Final  . Total Protein 08/14/2015 7.1  6.5 - 8.1 g/dL Final  . Albumin 08/14/2015 3.9  3.5 - 5.0 g/dL Final  . AST 08/14/2015 25  15 - 41 U/L Final  . ALT 08/14/2015 29  14 - 54 U/L Final  . Alkaline Phosphatase 08/14/2015 97  38 - 126 U/L Final  . Total Bilirubin 08/14/2015 0.5  0.3 - 1.2 mg/dL Final  . GFR calc non Af Amer 08/14/2015 >60  >60 mL/min Final  . GFR calc Af Amer 08/14/2015 >60  >60 mL/min Final   Comment: (NOTE) The eGFR has been calculated using the CKD EPI equation. This calculation has not been validated in all clinical situations. eGFR's persistently <60 mL/min signify possible Chronic Kidney Disease.   . Anion gap 08/14/2015 8  5 - 15 Final     STUDIES: Mammogram from outside institution has been reviewed. 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 Mammogram is been normal. We will send patient's breast tissue for breast cancer index to decide whether any further anti-hormonal therapy would be beneficial Reevaluate patient in one year or before if needed Is and will get mammogram done as outside institution Flu vaccine was given  Patient expressed understanding and was in agreement with this  plan. She also understands that She can call clinic at any time with any questions, concerns, or complaints.    No matching staging information was found for the patient.  Forest Gleason, MD   08/14/2015 3:03 PM

## 2015-08-27 ENCOUNTER — Encounter: Payer: Self-pay | Admitting: *Deleted

## 2015-08-27 NOTE — Progress Notes (Signed)
Breast Cancer Index testing cancelled due to unable to perform testing on patients with >3 positive lymph nodes. Pt had 5 positive lymph nodes. Pt needs to continue extended hormone therapy at this time. Will follow up with patient at next appt.

## 2015-09-11 ENCOUNTER — Other Ambulatory Visit: Payer: Self-pay

## 2015-09-11 ENCOUNTER — Other Ambulatory Visit: Payer: Self-pay | Admitting: Oncology

## 2015-09-11 ENCOUNTER — Ambulatory Visit: Payer: Self-pay | Admitting: Oncology

## 2015-10-10 ENCOUNTER — Other Ambulatory Visit: Payer: Self-pay | Admitting: Oncology

## 2015-11-21 ENCOUNTER — Other Ambulatory Visit: Payer: Self-pay | Admitting: *Deleted

## 2015-11-21 MED ORDER — VENLAFAXINE HCL ER 75 MG PO CP24: 75.0000 mg | ORAL_CAPSULE | Freq: Every day | ORAL | Status: DC

## 2015-11-22 ENCOUNTER — Other Ambulatory Visit: Payer: Self-pay | Admitting: Oncology

## 2016-01-26 ENCOUNTER — Other Ambulatory Visit: Payer: Self-pay | Admitting: *Deleted

## 2016-01-26 NOTE — Telephone Encounter (Signed)
Advised to call PCP for this refill per L Herring, AGNP-C

## 2016-04-12 ENCOUNTER — Other Ambulatory Visit: Payer: Self-pay | Admitting: *Deleted

## 2016-04-12 MED ORDER — LETROZOLE 2.5 MG PO TABS: 2.5000 mg | ORAL_TABLET | Freq: Every day | ORAL | Status: DC

## 2016-07-07 ENCOUNTER — Telehealth: Payer: Self-pay | Admitting: *Deleted

## 2016-07-07 DIAGNOSIS — Z853 Personal history of malignant neoplasm of breast: Secondary | ICD-10-CM

## 2016-07-07 NOTE — Telephone Encounter (Signed)
Patient needs to come in and sign a consent for records to be transferred to this provider.

## 2016-07-07 NOTE — Telephone Encounter (Signed)
Calling to request a referral to an oncologist at the [facility] since she has moved there and [place] has retired.  Dr. Audria Nine  [facility] Phone [phone] Fax [phone]  Need to notate on referral that she is due for a  Mammogram in November.

## 2016-07-07 NOTE — Telephone Encounter (Signed)
Spoke with Dr. Marius Ditch to send referral to Dr. Arlyss Repress. Referral entered. Sent msg to scheduling to arrange and sch. Apt.

## 2016-08-02 ENCOUNTER — Other Ambulatory Visit: Payer: Self-pay | Admitting: *Deleted

## 2016-08-02 MED ORDER — LETROZOLE 2.5 MG PO TABS: 2.5000 mg | ORAL_TABLET | Freq: Every day | ORAL | 0 refills | Status: AC

## 2016-08-02 MED ORDER — VENLAFAXINE HCL ER 75 MG PO CP24: 75.0000 mg | ORAL_CAPSULE | Freq: Every day | ORAL | 0 refills | Status: AC

## 2016-08-16 ENCOUNTER — Inpatient Hospital Stay: Payer: BC Managed Care – PPO

## 2016-08-16 ENCOUNTER — Other Ambulatory Visit: Payer: BC Managed Care – PPO

## 2016-08-16 ENCOUNTER — Ambulatory Visit: Payer: BC Managed Care – PPO | Admitting: Family Medicine

## 2016-08-16 ENCOUNTER — Ambulatory Visit: Payer: BC Managed Care – PPO | Admitting: Oncology

## 2016-11-02 ENCOUNTER — Other Ambulatory Visit: Payer: Self-pay | Admitting: Internal Medicine

## 2016-12-09 ENCOUNTER — Other Ambulatory Visit: Payer: Self-pay | Admitting: Internal Medicine

## 2017-11-11 ENCOUNTER — Telehealth: Payer: Self-pay | Admitting: *Deleted

## 2017-11-11 NOTE — Telephone Encounter (Signed)
Per pt's sister, Katharine Look. Patient had an abn mammo at [facility]. Needs additional u/s images to f/u on mammogram imaging. Wanted to let Dr. Sharmaine Base team know. msg acknowledged.
